# Patient Record
Sex: Male | Born: 1985 | Race: White | Hispanic: No | Marital: Married | Smoking: Current every day smoker
Health system: Southern US, Community
[De-identification: ages and names within clinical notes are randomized; demographics above are authoritative.]

---

## 2015-11-05 ENCOUNTER — Emergency Department (HOSPITAL_COMMUNITY)
Admission: EM | Admit: 2015-11-05 | Discharge: 2015-11-05 | Disposition: A | Discharge status: Home / Self Care | Payer: BLUE CROSS/BLUE SHIELD | Attending: Emergency Medicine | Admitting: Emergency Medicine

## 2015-11-05 ENCOUNTER — Emergency Department (HOSPITAL_COMMUNITY): Payer: BLUE CROSS/BLUE SHIELD

## 2015-11-05 ENCOUNTER — Encounter (HOSPITAL_COMMUNITY): Payer: Self-pay | Admitting: *Deleted

## 2015-11-05 DIAGNOSIS — S5002XA Contusion of left elbow, initial encounter: Secondary | ICD-10-CM | POA: Diagnosis not present

## 2015-11-05 DIAGNOSIS — Y9389 Activity, other specified: Secondary | ICD-10-CM | POA: Insufficient documentation

## 2015-11-05 DIAGNOSIS — W2209XA Striking against other stationary object, initial encounter: Secondary | ICD-10-CM | POA: Insufficient documentation

## 2015-11-05 DIAGNOSIS — S6992XA Unspecified injury of left wrist, hand and finger(s), initial encounter: Secondary | ICD-10-CM | POA: Diagnosis present

## 2015-11-05 DIAGNOSIS — F17200 Nicotine dependence, unspecified, uncomplicated: Secondary | ICD-10-CM | POA: Insufficient documentation

## 2015-11-05 DIAGNOSIS — Y990 Civilian activity done for income or pay: Secondary | ICD-10-CM | POA: Insufficient documentation

## 2015-11-05 DIAGNOSIS — Y9289 Other specified places as the place of occurrence of the external cause: Secondary | ICD-10-CM | POA: Insufficient documentation

## 2015-11-05 MED ORDER — DICLOFENAC SODIUM 50 MG PO TBEC: 50.0000 mg | DELAYED_RELEASE_TABLET | Freq: Two times a day (BID) | ORAL | Status: AC

## 2015-11-05 NOTE — Discharge Instructions (Signed)
Elbow Contusion °An elbow contusion is a deep bruise of the elbow. Contusions are the result of an injury that caused bleeding under the skin. The contusion may turn blue, purple, or yellow. Minor injuries will give you a painless contusion, but more severe contusions may stay painful and swollen for a few weeks.  °CAUSES  °An elbow contusion comes from a direct force to that area, such as falling on the elbow. °SYMPTOMS  °· Swelling and redness of the elbow. °· Bruising of the elbow area. °· Tenderness or soreness of the elbow. °DIAGNOSIS  °You will have a physical exam and will be asked about your history. You may need an X-ray of your elbow to look for a broken bone (fracture).  °TREATMENT  °A sling or splint may be needed to support your injury. Resting, elevating, and applying cold compresses to the elbow area are often the best treatments for an elbow contusion. Over-the-counter medicines may also be recommended for pain control. °HOME CARE INSTRUCTIONS  °· Put ice on the injured area. °¨ Put ice in a plastic bag. °¨ Place a towel between your skin and the bag. °¨ Leave the ice on for 15-20 minutes, 03-04 times a day. °· Only take over-the-counter or prescription medicines for pain, discomfort, or fever as directed by your caregiver. °· Rest your injured elbow until the pain and swelling are better. °· Elevate your elbow to reduce swelling. °· Apply a compression wrap as directed by your caregiver. This can help reduce swelling and motion. You may remove the wrap for sleeping, showers, and baths. If your fingers become numb, cold, or blue, take the wrap off and reapply it more loosely. °· Use your elbow only as directed by your caregiver. You may be asked to do range of motion exercises. Do them as directed. °· See your caregiver as directed. It is very important to keep all follow-up appointments in order to avoid any long-term problems with your elbow, including chronic pain or inability to move your elbow  normally. °SEEK IMMEDIATE MEDICAL CARE IF:  °· You have increased redness, swelling, or pain in your elbow. °· Your swelling or pain is not relieved with medicines. °· You have swelling of the hand and fingers. °· You are unable to move your fingers or wrist. °· You begin to lose feeling in your hand or fingers. °· Your fingers or hand become cold or blue. °MAKE SURE YOU:  °· Understand these instructions. °· Will watch your condition. °· Will get help right away if you are not doing well or get worse. °  °This information is not intended to replace advice given to you by your health care provider. Make sure you discuss any questions you have with your health care provider. °  °Document Released: 05/11/2006 Document Revised: 08/25/2011 Document Reviewed: 01/15/2015 °Elsevier Interactive Patient Education ©2016 [employer] ° °

## 2015-11-05 NOTE — ED Notes (Signed)
Pt in c/o pain to his left elbow after hitting it at work, reports pain radiating down into his hand and tingling, no deformity noted

## 2015-11-05 NOTE — ED Provider Notes (Signed)
History  By signing my name below, I, Nicholas Mullins, attest that this documentation has been prepared under the direction and in the presence of [facility] Neese, [place]. Electronically Signed: Earmon PhoenixJennifer Mullins, ED Scribe. 11/05/2015. 9:52 PM  Chief Complaint  Patient presents with  . Arm Injury   The history is provided by the patient and medical records. No language interpreter was used.    HPI Comments:  Nicholas Mullins is a 30 y.o. male who presents to the Emergency Department complaining of left elbow pain that began earlier today at work. He states he was trying to pry a bolt loose, slipping and hitting it on a metal object. He reports a tingling sensation from the elbow down to the fingers. He has not taken anything for pain. Flexion and extension of the elbow increases the pain. He denies alleviating factors. He denies numbness or weakness of the LUE, bruising or wounds.   History reviewed. No pertinent past medical history. History reviewed. No pertinent past surgical history. History reviewed. No pertinent family history. Social History  Substance Use Topics  . Smoking status: Current Every Day Smoker  . Smokeless tobacco: None  . Alcohol Use: None    Review of Systems  Musculoskeletal: Positive for arthralgias.       Left elbow pain   All other systems negative Allergies  Review of patient's allergies indicates no known allergies.  Home Medications   Prior to Admission medications   Medication Sig Start Date End Date Taking? Authorizing Provider  diclofenac (VOLTAREN) 50 MG EC tablet Take 1 tablet (50 mg total) by mouth 2 (two) times daily. 11/05/15   Hope Orlene OchM Neese, NP   BP 124/70 mmHg  Pulse 91  Temp(Src) 98.4 F (36.9 C) (Oral)  Resp 20  SpO2 100% Physical Exam  Constitutional: He is oriented to person, place, and time. He appears well-developed and well-nourished. No distress.  HENT:  Head: Normocephalic and atraumatic.  Eyes: Conjunctivae and EOM are normal.   Neck: Normal range of motion. Neck supple.  Cardiovascular: Normal rate.   Left radial pulse 2+. Adequate circulation.  Pulmonary/Chest: Effort normal.  Musculoskeletal: Normal range of motion. He exhibits tenderness.       Left elbow: He exhibits swelling. He exhibits normal range of motion, no deformity and no laceration. Tenderness found. Radial head tenderness noted.  Full ROM of left wrist and shoulder. Tenderness at radial head of LUE. Radial pulse +, adequate circulation.  Neurological: He is alert and oriented to person, place, and time. No cranial nerve deficit.  Skin: Skin is warm and dry.  Psychiatric: He has a normal mood and affect. His behavior is normal.  Nursing note and vitals reviewed.   ED Course  Procedures (including critical care time) DIAGNOSTIC STUDIES: Oxygen Saturation is 100% on on R/A COORDINATION OF CARE: 8:37 PM- Will X-Ray left elbow. Pt verbalizes understanding and agrees to plan.  9:51 PM- Informed pt of negative X-Ray. Will prescribe Voltaren and provide ace wrap.  Medications - No data to display  Labs Review Labs Reviewed - No data to display  Imaging Review Dg Elbow Complete Left  11/05/2015  CLINICAL DATA:  Initial encounter for left elbow pain that began earlier today at work. He states he was trying to pry a bolt loose, slipping and hitting it on a metal object. He reports a tingling sensation from the elbow down to the fingers. He has not taken anythi.*comment was truncated* EXAM: LEFT ELBOW - COMPLETE 3+ VIEW COMPARISON:  None.  FINDINGS: No acute fracture or dislocation. No joint effusion. No definite soft tissue swelling. IMPRESSION: No acute osseous abnormality. Electronically Signed   By: Jeronimo Greaves M.D.   On: 11/05/2015 21:41    MDM   Final diagnoses:  Contusion of left elbow, initial encounter    Patient X-Ray negative for obvious fracture or dislocation.  Pt advised to follow up with orthopedics. Patient given ace wrap, ice and  ibuprofen while in ED, conservative therapy recommended and discussed. Patient will be discharged home & is agreeable with above plan. Returns precautions discussed. Pt appears safe for discharge. Stable for d/c without focal neuro deficits.   I personally performed the services described in this documentation, which was scribed in my presence. The recorded information has been reviewed and is accurate.     [place], [place] 11/06/15 1644  Eber Hong, MD 11/07/15 1025

## 2017-05-20 IMAGING — DX DG ELBOW COMPLETE 3+V*L*
4 series · 4 of 4 positions shown · non-contrast
Comparison: None.

CLINICAL DATA: Initial encounter for left elbow pain that began
earlier today at work. He states he was trying to pry a bolt loose,
slipping and hitting it on a metal object. He reports a tingling
sensation from the elbow down to the fingers. He has not taken
anythi...*comment was truncated*

EXAM:
LEFT ELBOW - COMPLETE 3+ VIEW

[elbow ap]
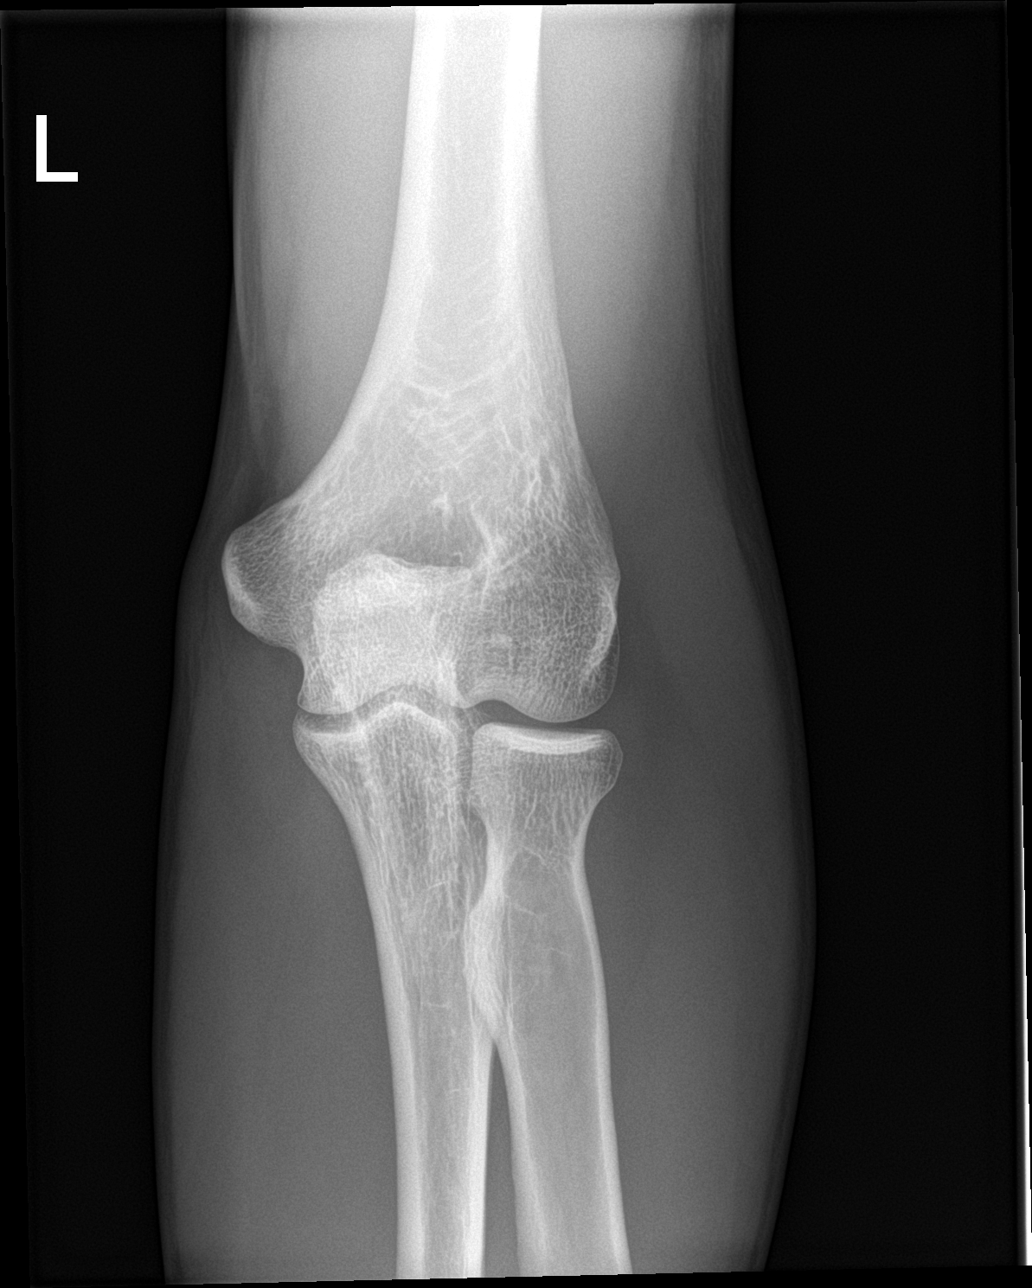

[elbow obl (1 of 2)]
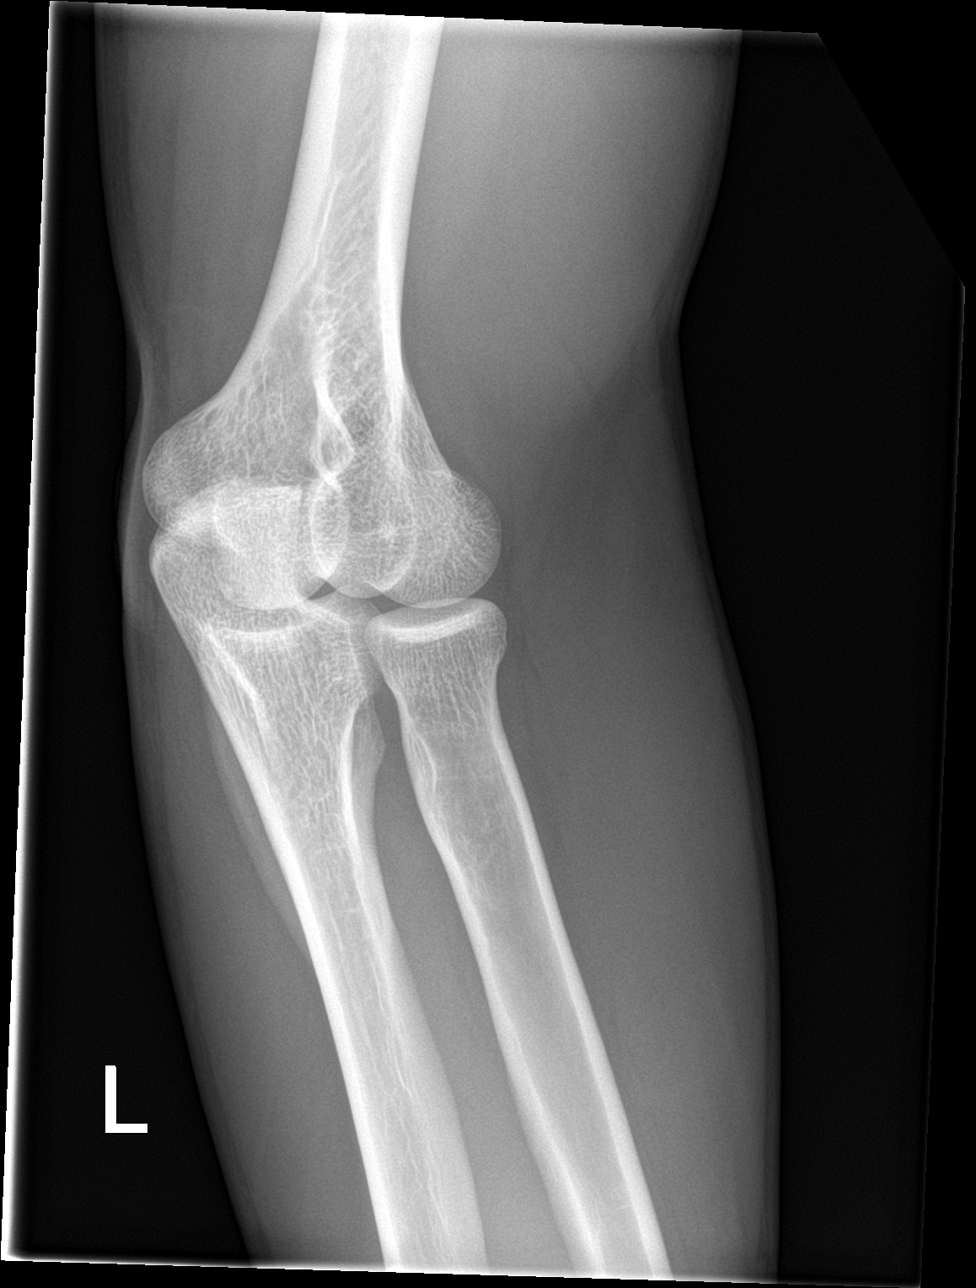

[elbow obl (2 of 2)]
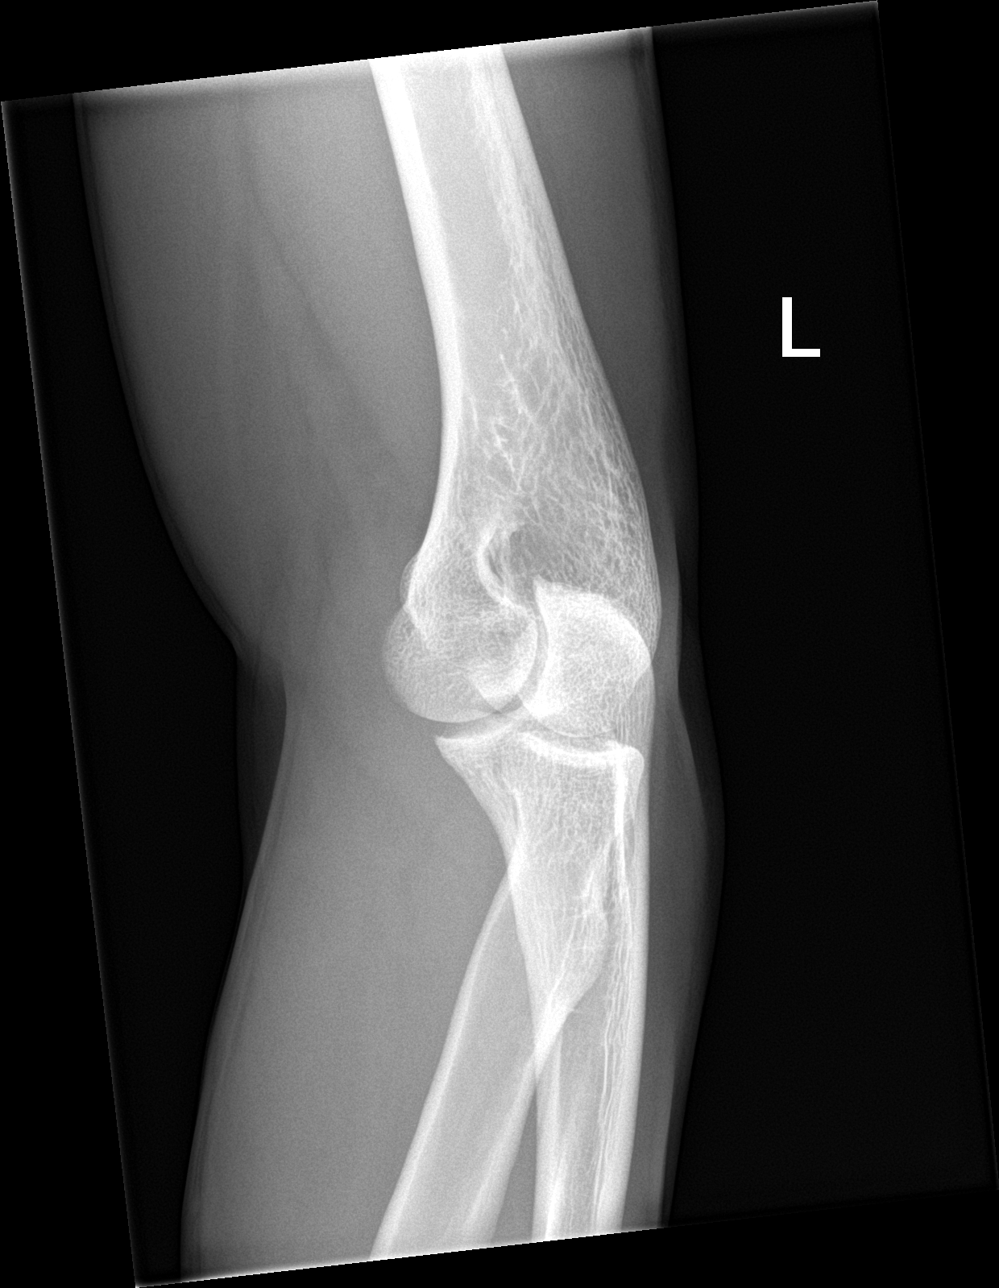

[elbow lat]
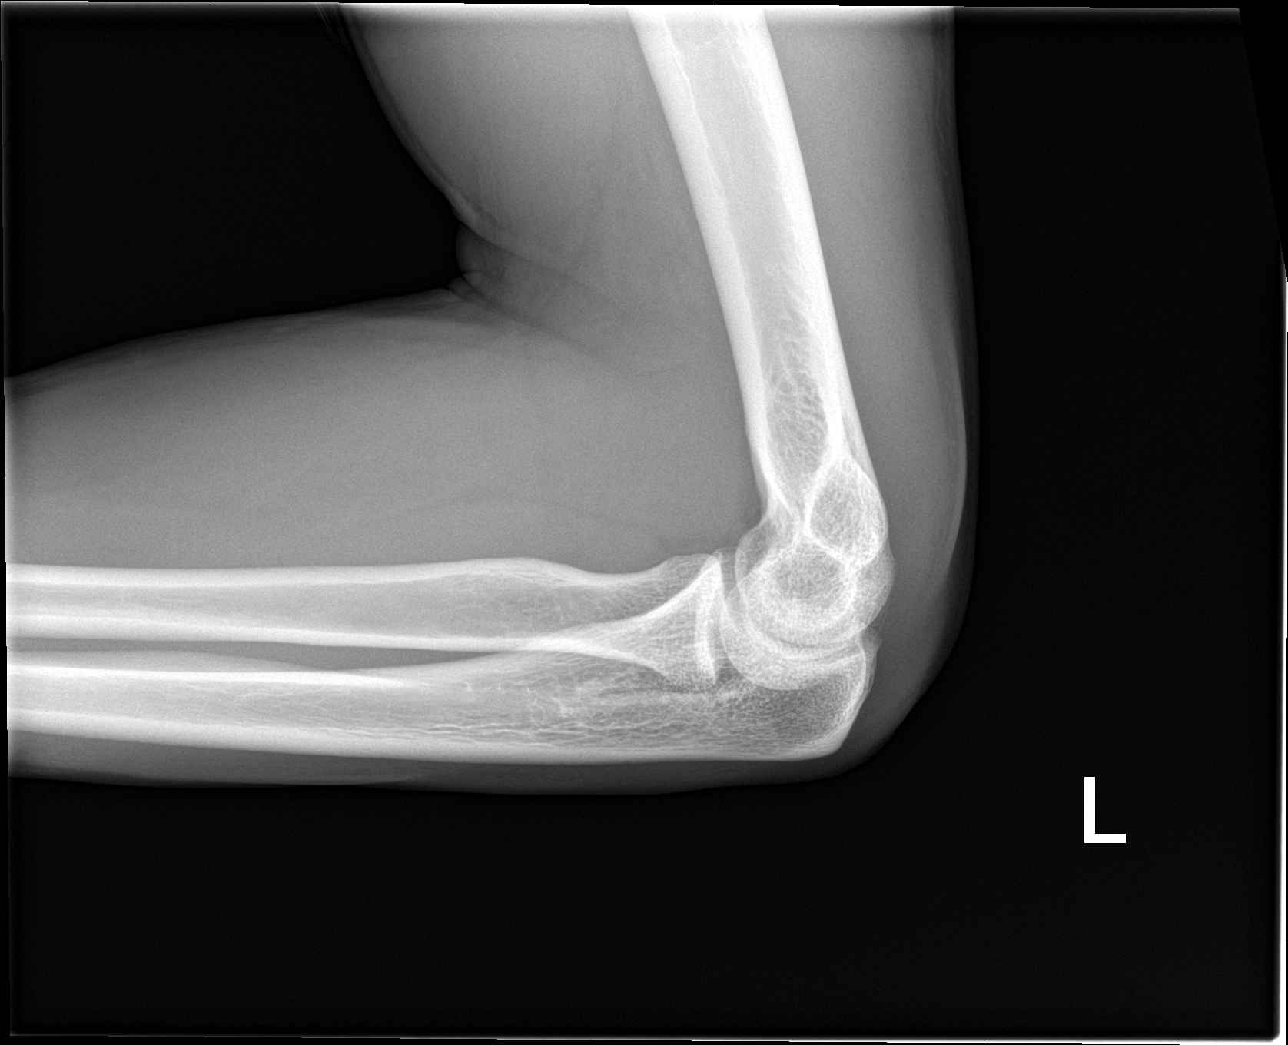

[4 of 4 positions shown; findings below may reference images not displayed]

FINDINGS: No acute fracture or dislocation. No joint effusion. No definite
soft tissue swelling.
IMPRESSION: No acute osseous abnormality.
